# Patient Record
Sex: Female | Born: 1990 | Race: Black or African American | Hispanic: No | Marital: Married | Smoking: Never smoker
Health system: Southern US, Community
[De-identification: ages and names within clinical notes are randomized; demographics above are authoritative.]

## PROBLEM LIST (undated history)

## (undated) DIAGNOSIS — I10 Essential (primary) hypertension: Secondary | ICD-10-CM

## (undated) DIAGNOSIS — M419 Scoliosis, unspecified: Secondary | ICD-10-CM

---

## 2019-10-15 DIAGNOSIS — I10 Essential (primary) hypertension: Secondary | ICD-10-CM | POA: Insufficient documentation

## 2019-10-15 DIAGNOSIS — E282 Polycystic ovarian syndrome: Secondary | ICD-10-CM | POA: Insufficient documentation

## 2020-06-25 ENCOUNTER — Emergency Department: Payer: No Typology Code available for payment source

## 2020-06-25 ENCOUNTER — Other Ambulatory Visit: Payer: Self-pay

## 2020-06-25 ENCOUNTER — Other Ambulatory Visit: Payer: Self-pay | Admitting: Physician Assistant

## 2020-06-25 ENCOUNTER — Emergency Department
Admission: EM | Admit: 2020-06-25 | Discharge: 2020-06-25 | Disposition: A | Discharge status: Home / Self Care | Payer: No Typology Code available for payment source | Attending: Emergency Medicine | Admitting: Emergency Medicine

## 2020-06-25 ENCOUNTER — Encounter: Payer: Self-pay | Admitting: Emergency Medicine

## 2020-06-25 DIAGNOSIS — S8002XA Contusion of left knee, initial encounter: Secondary | ICD-10-CM | POA: Diagnosis not present

## 2020-06-25 DIAGNOSIS — S80912A Unspecified superficial injury of left knee, initial encounter: Secondary | ICD-10-CM | POA: Diagnosis present

## 2020-06-25 MED ORDER — CYCLOBENZAPRINE HCL 10 MG PO TABS: 10.0000 mg | ORAL_TABLET | Freq: Three times a day (TID) | ORAL | 0 refills | Status: DC | PRN

## 2020-06-25 MED ORDER — LIDOCAINE 5 % EX PTCH: 1.0000 | MEDICATED_PATCH | Freq: Two times a day (BID) | CUTANEOUS | 0 refills | Status: DC
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
## 2020-06-25 NOTE — Discharge Instructions (Signed)
No acute findings on x-ray of your knee.  Follow discharge care instruction take medication as directed.
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
## 2020-09-07 ENCOUNTER — Other Ambulatory Visit (HOSPITAL_COMMUNITY): Payer: Self-pay | Admitting: Internal Medicine

## 2020-09-07 ENCOUNTER — Other Ambulatory Visit: Payer: Self-pay | Admitting: Internal Medicine

## 2020-09-07 DIAGNOSIS — R109 Unspecified abdominal pain: Secondary | ICD-10-CM

## 2020-09-07 DIAGNOSIS — R1030 Lower abdominal pain, unspecified: Secondary | ICD-10-CM

## 2020-09-15 ENCOUNTER — Ambulatory Visit: Admission: RE | Admit: 2020-09-15 | Payer: Medicaid Other | Source: Ambulatory Visit

## 2020-09-16 ENCOUNTER — Other Ambulatory Visit: Payer: Self-pay | Admitting: Internal Medicine

## 2020-09-16 ENCOUNTER — Other Ambulatory Visit (HOSPITAL_COMMUNITY): Payer: Self-pay | Admitting: Internal Medicine

## 2020-09-16 DIAGNOSIS — R1084 Generalized abdominal pain: Secondary | ICD-10-CM

## 2020-09-22 ENCOUNTER — Ambulatory Visit: Payer: Medicaid Other

## 2020-10-27 ENCOUNTER — Other Ambulatory Visit: Payer: Self-pay | Admitting: Internal Medicine

## 2020-10-27 DIAGNOSIS — R1030 Lower abdominal pain, unspecified: Secondary | ICD-10-CM

## 2020-10-27 DIAGNOSIS — R1084 Generalized abdominal pain: Secondary | ICD-10-CM

## 2020-11-03 ENCOUNTER — Ambulatory Visit
Admission: RE | Admit: 2020-11-03 | Discharge: 2020-11-03 | Disposition: A | Discharge status: Home / Self Care | Payer: Medicaid Other | Source: Ambulatory Visit | Attending: Internal Medicine | Admitting: Internal Medicine

## 2020-11-03 ENCOUNTER — Other Ambulatory Visit: Payer: Self-pay

## 2020-11-03 DIAGNOSIS — R1030 Lower abdominal pain, unspecified: Secondary | ICD-10-CM | POA: Diagnosis present

## 2020-11-03 DIAGNOSIS — R1084 Generalized abdominal pain: Secondary | ICD-10-CM | POA: Diagnosis present

## 2021-01-10 ENCOUNTER — Other Ambulatory Visit: Payer: Self-pay

## 2021-01-10 ENCOUNTER — Encounter: Payer: Self-pay | Admitting: Emergency Medicine

## 2021-01-10 ENCOUNTER — Emergency Department
Admission: EM | Admit: 2021-01-10 | Discharge: 2021-01-10 | Disposition: A | Discharge status: Home / Self Care | Payer: Medicaid Other | Attending: Emergency Medicine | Admitting: Emergency Medicine

## 2021-01-10 DIAGNOSIS — M545 Low back pain, unspecified: Secondary | ICD-10-CM | POA: Diagnosis present

## 2021-01-10 DIAGNOSIS — X501XXA Overexertion from prolonged static or awkward postures, initial encounter: Secondary | ICD-10-CM | POA: Diagnosis not present

## 2021-01-10 DIAGNOSIS — I10 Essential (primary) hypertension: Secondary | ICD-10-CM | POA: Insufficient documentation

## 2021-01-10 DIAGNOSIS — M5442 Lumbago with sciatica, left side: Secondary | ICD-10-CM | POA: Diagnosis not present

## 2021-01-10 DIAGNOSIS — M5432 Sciatica, left side: Secondary | ICD-10-CM

## 2021-01-10 DIAGNOSIS — Y92219 Unspecified school as the place of occurrence of the external cause: Secondary | ICD-10-CM | POA: Insufficient documentation

## 2021-01-10 HISTORY — DX: Scoliosis, unspecified: M41.9

## 2021-01-10 HISTORY — DX: Essential (primary) hypertension: I10

## 2021-01-10 MED ORDER — METHOCARBAMOL 750 MG PO TABS: 750.0000 mg | ORAL_TABLET | Freq: Three times a day (TID) | ORAL | 0 refills | Status: DC | PRN

## 2021-01-10 MED ORDER — METHYLPREDNISOLONE 4 MG PO TBPK: ORAL_TABLET | ORAL | 0 refills | Status: DC

## 2021-01-10 MED ORDER — LIDOCAINE 5 % EX PTCH
1.0000 | MEDICATED_PATCH | CUTANEOUS | Status: DC
  Administered 2021-01-10: 1 via TRANSDERMAL
  Filled 2021-01-10: qty 1

## 2021-01-10 MED ORDER — OXYCODONE-ACETAMINOPHEN 7.5-325 MG PO TABS
1.0000 | ORAL_TABLET | Freq: Four times a day (QID) | ORAL | 0 refills | Status: AC | PRN
Start: 2021-01-10 — End: 2021-01-15

## 2021-01-10 NOTE — ED Triage Notes (Signed)
C/O left lower back pain radiating down left leg since Sunday.  States was helping at her daughters school and may have injured back.

## 2021-01-10 NOTE — Discharge Instructions (Signed)
Read and follow discharge care instruction.  Be advised medication may cause drowsiness.

## 2021-01-10 NOTE — ED Notes (Signed)
See triage note  Presents with lower back pain  States pain started couple of days ago  States pain is moving into left leg  Ambulates well

## 2021-01-10 NOTE — ED Provider Notes (Signed)
[facility] Emergency Department Provider Note   ____________________________________________   Event Date/Time   First MD Initiated Contact with Patient 01/10/21 [phone]     (approximate)  I have reviewed the triage vital signs and the nursing notes.   HISTORY  Chief Complaint Back Pain    HPI Abigail House is a 30 y.o. female patient complaining of 2 days radicular back pain to the left lower extremity.  Patient states 4 days ago she was helping out daughter at school required repetitive bending and lifting.  Patient states the next morning she woke up with mild back pain which has increased to radicular back pain in the last 2 days.  Patient denies bladder or bowel dysfunction.  Patient denies any other cauda equina complaints.  Rates the pain as 7/10.  Described pain as "achy".  No palliative measure for complaint.         Past Medical History:  Diagnosis Date  . Hypertension   . Scoliosis     There are no problems to display for this patient.   History reviewed. No pertinent surgical history.  Prior to Admission medications   Medication Sig Start Date End Date Taking? Authorizing Provider  methocarbamol (ROBAXIN) 750 MG tablet Take 1 tablet (750 mg total) by mouth every 8 (eight) hours as needed for muscle spasms. 01/10/21  Yes Joni Reining, PA-C  methylPREDNISolone (MEDROL DOSEPAK) 4 MG TBPK tablet Take Tapered dose as directed 01/10/21  Yes Joni Reining, PA-C  oxyCODONE-acetaminophen (PERCOCET) 7.5-325 MG tablet Take 1 tablet by mouth every 6 (six) hours as needed for up to 5 days for severe pain. 01/10/21 01/15/21 Yes Joni Reining, PA-C    Allergies Patient has no known allergies.  No family history on file.  Social History Social History   Tobacco Use  . Smoking status: Never Smoker  . Smokeless tobacco: Never Used  Substance Use Topics  . Alcohol use: Not Currently  . Drug use: Never    Review of  Systems  Constitutional: No fever/chills Eyes: No visual changes. ENT: No sore throat. Cardiovascular: Denies chest pain. Respiratory: Denies shortness of breath. Gastrointestinal: No abdominal pain.  No nausea, no vomiting.  No diarrhea.  No constipation. Genitourinary: Negative for dysuria. Musculoskeletal: Positive for back pain. Skin: Negative for rash. Neurological: Negative for headaches, focal weakness or numbness. Endocrine:  Hypertension   ____________________________________________   PHYSICAL EXAM:  VITAL SIGNS: ED Triage Vitals  Enc Vitals Group     BP 01/10/21 0746 138/87     Pulse Rate 01/10/21 0745 97     Resp 01/10/21 0745 16     Temp 01/10/21 0745 97.9 F (36.6 C)     Temp Source 01/10/21 0745 Oral     SpO2 01/10/21 0745 98 %     Weight 01/10/21 0743 (!) 335 lb 1.6 oz (152 kg)     Height 01/10/21 0743 5\' 5"  (1.651 m)     Head Circumference --      Peak Flow --      Pain Score 01/10/21 0742 7     Pain Loc --      Pain Edu? --      Excl. in GC? --     Constitutional: Alert and oriented. Well appearing and in no acute distress.  BMI is 55.76. Cardiovascular: Normal rate, regular rhythm. Grossly normal heart sounds.  Good peripheral circulation. Respiratory: Normal respiratory effort.  No retractions. Lungs CTAB. Gastrointestinal: Soft and nontender. No distention.  No abdominal bruits. No CVA tenderness. Genitourinary: Deferred Musculoskeletal: Body habitus does limit the exam.  No obvious lumbar spine deformity.  Patient has decreased range of motion with right lateral movements and flexion.  Patient has left paraspinal muscle spasms.  No lower extremity tenderness nor edema.  No joint effusions.  Patient has positive left straight leg test. Neurologic:  Normal speech and language. No gross focal neurologic deficits are appreciated. No gait instability. Skin:  Skin is warm, dry and intact. No rash noted. Psychiatric: Mood and affect are normal. Speech and  behavior are normal.  ____________________________________________   LABS (all labs ordered are listed, but only abnormal results are displayed)  Labs Reviewed - No data to display ____________________________________________  EKG   ____________________________________________  RADIOLOGY I, Joni Reining, personally viewed and evaluated these images (plain radiographs) as part of my medical decision making, as well as reviewing the written report by the radiologist.  ED MD interpretation:    Official radiology report(s): No results found.  ____________________________________________   PROCEDURES  Procedure(s) performed (including Critical Care):  Procedures   ____________________________________________   INITIAL IMPRESSION / ASSESSMENT AND PLAN / ED COURSE  As part of my medical decision making, I reviewed the following data within the electronic MEDICAL RECORD NUMBER         Patient presents with 3 days radicular back pain secondary to repetitive lifting and flexion incident.  Patient complaint physical exam consistent with sciatica.  Patient given discharge care instructions advised take medicine as directed.  Advised on drowsy effects of pain medication and muscle relaxers.  Advise follow-up with PCP.      ____________________________________________   FINAL CLINICAL IMPRESSION(S) / ED DIAGNOSES  Final diagnoses:  Sciatica of left side     ED Discharge Orders         Ordered    methylPREDNISolone (MEDROL DOSEPAK) 4 MG TBPK tablet        01/10/21 0806    methocarbamol (ROBAXIN) 750 MG tablet  Every 8 hours PRN        01/10/21 0806    oxyCODONE-acetaminophen (PERCOCET) 7.5-325 MG tablet  Every 6 hours PRN        01/10/21 2229          *Please note:  Abigail House was evaluated in Emergency Department on 01/10/2021 for the symptoms described in the history of present illness. She was evaluated in the context of the global COVID-19 pandemic, which  necessitated consideration that the patient might be at risk for infection with the SARS-CoV-2 virus that causes COVID-19. Institutional protocols and algorithms that pertain to the evaluation of patients at risk for COVID-19 are in a state of rapid change based on information released by regulatory bodies including the CDC and federal and state organizations. These policies and algorithms were followed during the patient's care in the ED.  Some ED evaluations and interventions may be delayed as a result of limited staffing during and the pandemic.*   Note:  This document was prepared using Dragon voice recognition software and may include unintentional dictation errors.    Joni Reining, PA-C 01/10/21 7989    Sharman Cheek, MD 01/10/21 [phone]

## 2021-07-31 DIAGNOSIS — R7303 Prediabetes: Secondary | ICD-10-CM | POA: Insufficient documentation

## 2021-07-31 DIAGNOSIS — E785 Hyperlipidemia, unspecified: Secondary | ICD-10-CM | POA: Insufficient documentation

## 2021-11-08 ENCOUNTER — Other Ambulatory Visit: Payer: Self-pay

## 2021-11-08 ENCOUNTER — Encounter: Payer: Self-pay | Admitting: Emergency Medicine

## 2021-11-08 ENCOUNTER — Emergency Department
Admission: EM | Admit: 2021-11-08 | Discharge: 2021-11-08 | Disposition: A | Discharge status: Home / Self Care | Payer: Medicaid Other | Attending: Emergency Medicine | Admitting: Emergency Medicine

## 2021-11-08 DIAGNOSIS — M5442 Lumbago with sciatica, left side: Secondary | ICD-10-CM | POA: Insufficient documentation

## 2021-11-08 DIAGNOSIS — I10 Essential (primary) hypertension: Secondary | ICD-10-CM | POA: Insufficient documentation

## 2021-11-08 DIAGNOSIS — M545 Low back pain, unspecified: Secondary | ICD-10-CM | POA: Diagnosis present

## 2021-11-08 LAB — URINALYSIS, ROUTINE W REFLEX MICROSCOPIC
Bilirubin Urine: NEGATIVE
Glucose, UA: NEGATIVE mg/dL
Hgb urine dipstick: NEGATIVE
Ketones, ur: NEGATIVE mg/dL
Leukocytes,Ua: NEGATIVE
Nitrite: NEGATIVE
Protein, ur: NEGATIVE mg/dL
Specific Gravity, Urine: 1.025 (ref 1.005–1.030)
pH: 5 (ref 5.0–8.0)

## 2021-11-08 LAB — POC URINE PREG, ED: Preg Test, Ur: NEGATIVE

## 2021-11-08 MED ORDER — METHOCARBAMOL 500 MG PO TABS: 500.0000 mg | ORAL_TABLET | Freq: Four times a day (QID) | ORAL | 0 refills | Status: AC | PRN

## 2021-11-08 MED ORDER — OXYCODONE-ACETAMINOPHEN 7.5-325 MG PO TABS: 1.0000 | ORAL_TABLET | Freq: Four times a day (QID) | ORAL | 0 refills | Status: DC | PRN

## 2021-11-08 MED ORDER — LIDOCAINE 5 % EX PTCH
1.0000 | MEDICATED_PATCH | CUTANEOUS | Status: DC
  Administered 2021-11-08: 1 via TRANSDERMAL
  Filled 2021-11-08: qty 1

## 2021-11-08 MED ORDER — PREDNISONE 10 MG PO TABS: ORAL_TABLET | ORAL | 0 refills | Status: DC

## 2021-11-08 NOTE — Discharge Instructions (Signed)
Follow with your primary care provider if not improving or still having problems of sciatica.  You may also use moist heat or ice to your back as needed for discomfort.  3 prescriptions were sent to the pharmacy that are the medicines that you had last year that helped with your back pain and sciatica.  Be aware that the methocarbamol and the pain medication oxycodone taken together could cause drowsiness and increase your risk for injury.  Also do not drive or operate machinery while taking this medication.  The prednisone is a tapered dose like last time. ?

## 2021-11-08 NOTE — ED Notes (Signed)
See triage note  presents with lower back pain  states pain is on the right lower and moves into right leg  ambulates well  denies any recent injury ?

## 2021-11-08 NOTE — ED Triage Notes (Signed)
Pt comes into the ED via POV c/o right lower back pain that radiates down the right leg.  H/o sciatica.  Pt in NAD at this time with even and unlabored respirations.  ?

## 2021-11-08 NOTE — ED Provider Notes (Signed)
? ?[facility] ?Provider Note ? ? ? Event Date/Time  ? First MD Initiated Contact with Patient 11/08/21 0801   ?  (approximate) ? ? ?History  ? ?Back Pain and Leg Pain ? ? ?HPI ? ?Abigail House is a 31 y.o. female   sent to the ED with complaint of left lower back pain that radiates down her left leg to her knee and slightly below but not completely to her toes.  Patient also has back pain that is bilateral without sciatica to her right leg.  Patient has a history of sciatica and was seen in the emergency department approximately 1 year ago.  Patient has a history of hypertension and scoliosis.  Currently she rates her pain as 10/10. ? ?  ? ? ?Physical Exam  ? ?Triage Vital Signs: ?ED Triage Vitals  ?Enc Vitals Group  ?   BP 11/08/21 0757 (!) 149/103  ?   Pulse Rate 11/08/21 0757 (!) 113  ?   Resp 11/08/21 0757 16  ?   Temp 11/08/21 0757 98.2 ?F (36.8 ?C)  ?   Temp Source 11/08/21 0757 Oral  ?   SpO2 11/08/21 0757 98 %  ?   Weight 11/08/21 0749 (!) 335 lb 1.6 oz (152 kg)  ?   Height 11/08/21 0749 5\' 5"  (1.651 m)  ?   Head Circumference --   ?   Peak Flow --   ?   Pain Score 11/08/21 0749 10  ?   Pain Loc --   ?   Pain Edu? --   ?   Excl. in [place]? --   ? ? ?Most recent vital signs: ?Vitals:  ? 11/08/21 0757  ?BP: (!) 149/103  ?Pulse: (!) 113  ?Resp: 16  ?Temp: 98.2 ?F (36.8 ?C)  ?SpO2: 98%  ? ? ? ?General: Awake, no distress.  Standing in the room and is ambulatory without any assistance. ?CV:  Good peripheral perfusion.  Heart regular rate and rhythm. ?Resp:  Normal effort.  Clear bilaterally. ?Abd:  No distention.  ?Other:  On examination of the back there is no gross deformity however there is tenderness on palpation of L5-S1 and sacral area.  No point tenderness is appreciated in the SI joint area however body habitus may be interfering.  Patient is ambulatory without any assistance.  Good muscle strength bilaterally. ? ? ?ED Results / Procedures / Treatments  ? ?Labs ?(all labs ordered are  listed, but only abnormal results are displayed) ?Labs Reviewed  ?URINALYSIS, ROUTINE W REFLEX MICROSCOPIC - Abnormal; Notable for the following components:  ?    Result Value  ? Color, Urine YELLOW (*)   ? APPearance HAZY (*)   ? All other components within normal limits  ?POC URINE PREG, ED  ? ? ? ? ?RADIOLOGY ?Deferred ? ? ? ?PROCEDURES: ? ?Critical Care performed:  ? ?Procedures ? ? ?MEDICATIONS ORDERED IN ED: ?Medications - No data to display ? ? ?IMPRESSION / MDM / ASSESSMENT AND PLAN / ED COURSE  ?I reviewed the triage vital signs and the nursing notes. ? ? ?Differential diagnosis includes, but is not limited to, sciatica with bilateral radiculopathy, urinary tract infection, kidney stone, muscle skeletal strain. ? ? ?31 year old female presents to the ED with complaint of low back pain with radiation into legs bilaterally.  No history of injury.  Patient has a history of sciatica and has been seen in the past for the same.  Patient also wanted to rule out a  UTI and this was negative patient was reassured.  Because this is very similar to her symptoms prior we will use same medication that she was prescribed last time as this relieved her symptoms.  A Lidoderm patch was applied while she was in the emergency department.  Prescription for methocarbamol, prednisone and oxycodone was sent to the pharmacy.  She is encouraged to follow-up with orthopedics if any continued problems or her PCP. ? ? ?  ? ? ?FINAL CLINICAL IMPRESSION(S) / ED DIAGNOSES  ? ?Final diagnoses:  ?Acute left-sided low back pain with left-sided sciatica  ? ? ? ?Rx / DC Orders  ? ?ED Discharge Orders   ? ?      Ordered  ?  methocarbamol (ROBAXIN) 500 MG tablet  Every 6 hours PRN       ? 11/08/21 [id number]  ?  predniSONE (DELTASONE) 10 MG tablet       ? 11/08/21 [id number]  ?  oxyCODONE-acetaminophen (PERCOCET) 7.5-325 MG tablet  Every 6 hours PRN       ? 11/08/21 [id number]  ? ?  ?  ? ?  ? ? ? ?Note:  This document was prepared using Dragon voice recognition  software and may include unintentional dictation errors. ?  ?Johnn Hai, PA-C ?11/08/21 1555 ? ?  ?Vladimir Crofts, MD ?11/09/21 2326 ? ?

## 2021-11-20 ENCOUNTER — Ambulatory Visit (INDEPENDENT_AMBULATORY_CARE_PROVIDER_SITE_OTHER): Payer: Medicaid Other | Admitting: Internal Medicine

## 2021-11-20 VITALS — BP 137/83 | HR 99 | Resp 16 | Ht 64.0 in | Wt 341.0 lb

## 2021-11-20 DIAGNOSIS — G4733 Obstructive sleep apnea (adult) (pediatric): Secondary | ICD-10-CM

## 2021-11-20 NOTE — Progress Notes (Signed)
Sleep Medicine  ? ?Office Visit ? ?Patient Name: Abigail House ?DOB: June 01, 1991 ?MRN [id number] ? ? ? ?Chief Complaint: sleep evaluation/bariatric surgery candidate ? ?Brief History: ? ?Abigail House presents for initial consult for weight loss surgery evaluation.  Patient  said she has 3 month history of the following symptoms: fatigue, daytime sleepiness, loud snoring,  hypertension, hyperlipidemia,  STOP-bang score 3/8.  Patient states all symptoms are improved due to recent weight loss.   she said she keeps working 5 hours and does [employer] eats, so staying active keeps her alert. Sleep quality is good. The patient's bed partner reports  no issues at night. The patient relates the following symptoms: currently, heavy snoring if she's really tired.  are also present. The patient goes to sleep at 11pm and wakes up at 6 am.  Sleep quality is good when outside home environment.  Patient has noted no restlessness of her legs at night.  The patient  relates no unusual behavior during the night.  The patient denies a history of psychiatric problems. The Epworth Sleepiness Score is 2 out of 24 .  The patient relates  Cardiovascular risk factors include: hypertension  ? ? ?ROS ? ?General: (-) fever, (-) chills, (-) night sweat ?Nose and Sinuses: (-) nasal stuffiness or itchiness, (-) postnasal drip, (-) nosebleeds, (-) sinus trouble. ?Mouth and Throat: (-) sore throat, (-) hoarseness. ?Neck: (-) swollen glands, (-) enlarged thyroid, (-) neck pain. ?Respiratory: - cough, - shortness of breath, - wheezing. ?Neurologic: - numbness, - tingling. ?Psychiatric: - anxiety, - depression ?Sleep behavior: -sleep paralysis -hypnogogic hallucinations -dream enactment  ?    -vivid dreams -cataplexy -night terrors -sleep walking ? ? ?Current Medication: ?Outpatient Encounter Medications as of 11/20/2021  ?Medication Sig  ? losartan-hydrochlorothiazide (HYZAAR) 100-12.5 MG tablet Take 1 tablet by mouth daily.  ? metFORMIN (GLUCOPHAGE-XR) 500 MG 24  hr tablet Take 500 mg by mouth 2 (two) times daily.  ? methocarbamol (ROBAXIN) 500 MG tablet Take 1 tablet (500 mg total) by mouth every 6 (six) hours as needed.  ? NIFEdipine (PROCARDIA XL/NIFEDICAL XL) 60 MG 24 hr tablet Take 60 mg by mouth daily.  ? pravastatin (PRAVACHOL) 40 MG tablet Take 40 mg by mouth daily.  ? [DISCONTINUED] oxyCODONE-acetaminophen (PERCOCET) 7.5-325 MG tablet Take 1 tablet by mouth every 6 (six) hours as needed for severe pain.  ? [DISCONTINUED] predniSONE (DELTASONE) 10 MG tablet Take 6 tablets  today, on day 2 take 5 tablets, day 3 take 4 tablets, day 4 take 3 tablets, day 5 take  2 tablets and 1 tablet the last day  ? ?No facility-administered encounter medications on file as of 11/20/2021.  ? ? ?Surgical History: ?History reviewed. No pertinent surgical history. ? ?Medical History: ?Past Medical History:  ?Diagnosis Date  ? Hypertension   ? Scoliosis   ? ? ?Family History: ?Non contributory to the present illness ? ?Social History: ?Social History  ? ?Socioeconomic History  ? Marital status: Married  ?  Spouse name: Not on file  ? Number of children: Not on file  ? Years of education: Not on file  ? Highest education level: Not on file  ?Occupational History  ? Not on file  ?Tobacco Use  ? Smoking status: Never  ? Smokeless tobacco: Never  ?Substance and Sexual Activity  ? Alcohol use: Not Currently  ? Drug use: Never  ? Sexual activity: Not on file  ?Other Topics Concern  ? Not on file  ?Social History Narrative  ? Not on  file  ? ?Social Determinants of Health  ? ?Financial Resource Strain: Not on file  ?Food Insecurity: Not on file  ?Transportation Needs: Not on file  ?Physical Activity: Not on file  ?Stress: Not on file  ?Social Connections: Not on file  ?Intimate Partner Violence: Not on file  ? ? ?Vital Signs: ?Blood pressure 137/83, pulse 99, resp. rate 16, height 5\' 4"  (1.626 m), weight (!) 341 lb (154.7 kg), SpO2 99 %. ?Body mass index is 58.53 kg/m?.  ? ?Examination: ?General  Appearance: The patient is well-developed, well-nourished, and in no distress. ?Neck Circumference: 41 cm ?Skin: Gross inspection of skin unremarkable. ?Head: normocephalic, no gross deformities. ?Eyes: no gross deformities noted. ?ENT: ears appear grossly normal ?Neurologic: Alert and oriented. No involuntary movements. ? ? ? ?EPWORTH SLEEPINESS SCALE: ? ?Scale:  ?(0)= no chance of dozing; (1)= slight chance of dozing; (2)= moderate chance of dozing; (3)= high chance of dozing ? ?Chance  Situtation ?   ?Sitting and reading: 1 ?  ? Watching TV: 0 ?   ?Sitting Inactive in public: 0 ?   ?As a passenger in car: 0   ?   ?Lying down to rest: 1 ?   ?Sitting and talking: 0 ?   ?Sitting quielty after lunch: 0 ?   ?In a car, stopped in traffic: 0 ? ? ?TOTAL SCORE:   2 out of 24 ? ? ? ?SLEEP STUDIES: ? ?No studies on file. ? ? ?LABS: ?Recent Results (from the past 2160 hour(s))  ?Urinalysis, Routine w reflex microscopic Urine, Clean Catch     Status: Abnormal  ? Collection Time: 11/08/21  8:26 AM  ?Result Value Ref Range  ? Color, Urine YELLOW (A) YELLOW  ? APPearance HAZY (A) CLEAR  ? Specific Gravity, Urine 1.025 1.005 - 1.030  ? pH 5.0 5.0 - 8.0  ? Glucose, UA NEGATIVE NEGATIVE mg/dL  ? Hgb urine dipstick NEGATIVE NEGATIVE  ? Bilirubin Urine NEGATIVE NEGATIVE  ? Ketones, ur NEGATIVE NEGATIVE mg/dL  ? Protein, ur NEGATIVE NEGATIVE mg/dL  ? Nitrite NEGATIVE NEGATIVE  ? Leukocytes,Ua NEGATIVE NEGATIVE  ?  Comment: Performed at [facility], [address] [place], [place]  ?POC urine preg, ED     Status: None  ? Collection Time: 11/08/21  8:51 AM  ?Result Value Ref Range  ? Preg Test, Ur NEGATIVE NEGATIVE  ?  Comment:        ?THE SENSITIVITY OF THIS ?METHODOLOGY IS >24 mIU/mL ?  ? ? ?Radiology: ?No results found. ? ?No results found. ? ?No results found. ? ? ? ?Assessment and Plan: ?Patient Active Problem List  ? Diagnosis Date Noted  ? OSA (obstructive sleep apnea) 11/20/2021  ? Morbid obesity (HCC)  11/20/2021  ? Hyperlipidemia 07/31/2021  ? Borderline diabetes 07/31/2021  ? PCOS (polycystic ovarian syndrome) 10/15/2019  ? Essential hypertension 10/15/2019  ? ? ? ?PLAN OSA:  ? ?Patient evaluation suggests high risk of sleep disordered breathing due to morbid obesity (BMI 58.53), snoring. Patient has comorbid cardiovascular risk factors including: hypertension which could be exacerbated by pathologic sleep-disordered breathing. ? ?Suggest: PSG  to assess the patient's sleep disordered breathing. The patient was also counselled on weight loss to optimize sleep health. ? ? ? ? ? ?General Counseling: I have discussed the findings of the evaluation and examination with 10/17/2019.  I have also discussed any further diagnostic evaluation thatmay be needed or ordered today. Abigail House verbalizes understanding of the findings of todays visit. We also  reviewed her medications today and discussed drug interactions and side effects including but not limited excessive drowsiness and altered mental states. We also discussed that there is always a risk not just to her but also people around her. she has been encouraged to call the office with any questions or concerns that should arise related to todays visit. ? ?No orders of the defined types were placed in this encounter. ?  ? ? ? ? ?I have personally obtained a history, evaluated the patient, evaluated pertinent data, formulated the assessment and plan and placed orders. ? ?This patient was seen today by Emmaline KluverSarah Terrell, PA-C in collaboration with Dr. Freda MunroSaadat Kipp Shank.  ?  ?Yevonne PaxSaadat A Maxine Huynh, MD FCCP ?Diplomate ABMS ?Pulmonary and Critical Care Medicine ?Sleep medicine  ?
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
## 2022-04-26 ENCOUNTER — Telehealth: Payer: Self-pay | Admitting: Internal Medicine

## 2022-04-26 NOTE — Telephone Encounter (Signed)
Feeling Abigail House has been unable to schedule patient due to "not being able to reach the patient" tat
